# Patient Record
Sex: Female | Born: 2000 | Race: White | Hispanic: No | Marital: Single | Smoking: Never smoker
Health system: Southern US, Community
[De-identification: ages and names within clinical notes are randomized; demographics above are authoritative.]

## PROBLEM LIST (undated history)

## (undated) DIAGNOSIS — J309 Allergic rhinitis, unspecified: Secondary | ICD-10-CM

## (undated) DIAGNOSIS — T7840XA Allergy, unspecified, initial encounter: Secondary | ICD-10-CM

## (undated) DIAGNOSIS — J4520 Mild intermittent asthma, uncomplicated: Secondary | ICD-10-CM

## (undated) DIAGNOSIS — N920 Excessive and frequent menstruation with regular cycle: Secondary | ICD-10-CM

## (undated) HISTORY — DX: Allergic rhinitis, unspecified: J30.9

## (undated) HISTORY — DX: Allergy, unspecified, initial encounter: T78.40XA

## (undated) HISTORY — PX: WISDOM TOOTH EXTRACTION: SHX21

## (undated) HISTORY — DX: Excessive and frequent menstruation with regular cycle: N92.0

## (undated) HISTORY — DX: Mild intermittent asthma, uncomplicated: J45.20

---
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
## 2022-01-22 ENCOUNTER — Ambulatory Visit: Payer: No Typology Code available for payment source | Admitting: Family Medicine

## 2022-01-22 NOTE — Progress Notes (Unsigned)
   [address] [place] [address] Dept: [phone]  FOLLOW UP NOTE  Patient ID: Crystal Bush, female    DOB: December 03, 2000  Age: 21 y.o. MRN: [id number] Date of Office Visit: 01/23/2022  Assessment  Chief Complaint: No chief complaint on file.  HPI Crystal Bush is a 21 year old female who presents to the clinic for follow-up visit.  She was last seen in this clinic on 11/17/2020 by Thermon Leyland, FNP, for evaluation of asthma, allergic rhinitis, allergic conjunctivitis, and anosmia.  Her last environmental allergy testing was   Drug Allergies:  Allergies  Allergen Reactions   Amoxicillin Hives    Physical Exam: There were no vitals taken for this visit.   Physical Exam  Diagnostics:    Assessment and Plan: No diagnosis found.  No orders of the defined types were placed in this encounter.   There are no Patient Instructions on file for this visit.  No follow-ups on file.    Thank you for the opportunity to care for this patient.  Please do not hesitate to contact me with questions.  Thermon Leyland, FNP [facility]

## 2022-01-23 ENCOUNTER — Ambulatory Visit (INDEPENDENT_AMBULATORY_CARE_PROVIDER_SITE_OTHER): Payer: No Typology Code available for payment source | Admitting: Family Medicine

## 2022-01-23 ENCOUNTER — Encounter: Payer: Self-pay | Admitting: Family Medicine

## 2022-01-23 ENCOUNTER — Other Ambulatory Visit: Payer: Self-pay

## 2022-01-23 VITALS — BP 114/68 | HR 67 | Temp 98.7°F | Resp 14 | Ht 64.0 in | Wt 132.5 lb

## 2022-01-23 DIAGNOSIS — J4540 Moderate persistent asthma, uncomplicated: Secondary | ICD-10-CM | POA: Diagnosis not present

## 2022-01-23 DIAGNOSIS — J302 Other seasonal allergic rhinitis: Secondary | ICD-10-CM

## 2022-01-23 DIAGNOSIS — R430 Anosmia: Secondary | ICD-10-CM | POA: Diagnosis not present

## 2022-01-23 DIAGNOSIS — H1013 Acute atopic conjunctivitis, bilateral: Secondary | ICD-10-CM | POA: Diagnosis not present

## 2022-01-23 DIAGNOSIS — H1010 Acute atopic conjunctivitis, unspecified eye: Secondary | ICD-10-CM

## 2022-01-23 DIAGNOSIS — J3089 Other allergic rhinitis: Secondary | ICD-10-CM | POA: Diagnosis not present

## 2022-01-23 MED ORDER — LEVOCETIRIZINE DIHYDROCHLORIDE 5 MG PO TABS: 5.0000 mg | ORAL_TABLET | Freq: Every evening | ORAL | 5 refills | Status: DC

## 2022-01-23 MED ORDER — ALBUTEROL SULFATE HFA 108 (90 BASE) MCG/ACT IN AERS: INHALATION_SPRAY | RESPIRATORY_TRACT | 0 refills | Status: DC

## 2022-01-23 MED ORDER — MOMETASONE FUROATE 50 MCG/ACT NA SUSP: 1.0000 | Freq: Every day | NASAL | 4 refills | Status: DC

## 2022-01-23 MED ORDER — ARNUITY ELLIPTA 200 MCG/ACT IN AEPB: 1.0000 | INHALATION_SPRAY | Freq: Every day | RESPIRATORY_TRACT | 5 refills | Status: DC

## 2022-01-23 NOTE — Addendum Note (Signed)
Addended by: Rolland Bimler D on: 01/23/2022 04:58 PM   Modules accepted: Orders

## 2022-01-23 NOTE — Addendum Note (Signed)
Addended by: Kellie Simmering, Luciann Gossett on: 01/23/2022 04:37 PM   Modules accepted: Orders

## 2022-01-23 NOTE — Patient Instructions (Signed)
Asthma Continue Arnuity 200-1 puff once a day to prevent cough or wheeze. This will replace Arnuity 100 Continue albuterol 2 puffs once every 4 hours as needed for cough or wheeze You may use albuterol 2 puffs 5 to 15 minutes before activity to decrease cough or wheeze  Allergic rhinitis Continue allergen avoidance measures directed toward grass pollen, weed pollen, tree pollen, mold, and dust mite as listed below Continue levocetirizine (Xyzal) 5 mg once a day.  This will replace cetirizine (Zyrtec) for now. Remember to rotate to a different antihistamine about every 3 months. Some examples of over the counter antihistamines include Zyrtec (cetirizine), Xyzal (levocetirizine), Allegra (fexofenadine), and Claritin (loratidine).  Continue Nasonex 1 spray in each nostril once a day as needed for stuffy nose.  In the right nostril, point the applicator out toward the right ear. In the left nostril, point the applicator out toward the left ear Consider saline nasal rinses as needed for nasal symptoms. Use this before any medicated nasal sprays for best result Consider allergen immunotherapy if your symptoms are not well-controlled with the treatment plan as listed above  Allergic conjunctivitis Some over the counter eye drops include Pataday one drop in each eye once a day as needed for red, itchy eyes OR Zaditor one drop in each eye twice a day as needed for red itchy eyes.  Anosmia Continue Nasonex 1 spray in each nostril once a day as needed for stuffy nose.  In the right nostril, point the applicator out toward the right ear. In the left nostril, point the applicator out toward the left ear Continue to follow-up with Dr. Suszanne Conners  Call the clinic if this treatment plan is not working well for you  Follow up in 6 months or sooner if needed.  Reducing Pollen Exposure The American Academy of Allergy, Asthma and Immunology suggests the following steps to reduce your exposure to pollen during allergy  seasons. Do not hang sheets or clothing out to dry; pollen may collect on these items. Do not mow lawns or spend time around freshly cut grass; mowing stirs up pollen. Keep windows closed at night.  Keep car windows closed while driving. Minimize morning activities outdoors, a time when pollen counts are usually at their highest. Stay indoors as much as possible when pollen counts or humidity is high and on windy days when pollen tends to remain in the air longer. Use air conditioning when possible.  Many air conditioners have filters that trap the pollen spores. Use a HEPA room air filter to remove pollen form the indoor air you breathe.  Control of Mold Allergen Mold and fungi can grow on a variety of surfaces provided certain temperature and moisture conditions exist.  Outdoor molds grow on plants, decaying vegetation and soil.  The major outdoor mold, Alternaria and Cladosporium, are found in very high numbers during hot and dry conditions.  Generally, a late Summer - Fall peak is seen for common outdoor fungal spores.  Rain will temporarily lower outdoor mold spore count, but counts rise rapidly when the rainy period ends.  The most important indoor molds are Aspergillus and Penicillium.  Dark, humid and poorly ventilated basements are ideal sites for mold growth.  The next most common sites of mold growth are the bathroom and the kitchen.  Outdoor [employer] Use air conditioning and keep windows closed Avoid exposure to decaying vegetation. Avoid leaf raking. Avoid grain handling. Consider wearing a face mask if working in moldy areas.  Indoor Mold  Control Maintain humidity below 50%. Clean washable surfaces with 5% bleach solution. Remove sources e.g. Contaminated carpets.   Control of Dust Mite Allergen Dust mites play a major role in allergic asthma and rhinitis. They occur in environments with high humidity wherever human skin is found. Dust mites absorb humidity from the  atmosphere (ie, they do not drink) and feed on organic matter (including shed human and animal skin). Dust mites are a microscopic type of insect that you cannot see with the naked eye. High levels of dust mites have been detected from mattresses, pillows, carpets, upholstered furniture, bed covers, clothes, soft toys and any woven material. The principal allergen of the dust mite is found in its feces. A gram of dust may contain 1,000 mites and 250,000 fecal particles. Mite antigen is easily measured in the air during house cleaning activities. Dust mites do not bite and do not cause harm to humans, other than by triggering allergies/asthma.  Ways to decrease your exposure to dust mites in your home:  1. Encase mattresses, box springs and pillows with a mite-impermeable barrier or cover  2. Wash sheets, blankets and drapes weekly in hot water (130 F) with detergent and dry them in a dryer on the hot setting.  3. Have the room cleaned frequently with a vacuum cleaner and a damp dust-mop. For carpeting or rugs, vacuuming with a vacuum cleaner equipped with a high-efficiency particulate air (HEPA) filter. The dust mite allergic individual should not be in a room which is being cleaned and should wait 1 hour after cleaning before going into the room.  4. Do not sleep on upholstered furniture (eg, couches).  5. If possible removing carpeting, upholstered furniture and drapery from the home is ideal. Horizontal blinds should be eliminated in the rooms where the person spends the most time (bedroom, study, television room). Washable vinyl, roller-type shades are optimal.  6. Remove all non-washable stuffed toys from the bedroom. Wash stuffed toys weekly like sheets and blankets above.  7. Reduce indoor humidity to less than 50%. Inexpensive humidity monitors can be purchased at most hardware stores. Do not use a humidifier as can make the problem worse and are not recommended.

## 2022-04-04 ENCOUNTER — Encounter: Payer: Self-pay | Admitting: Radiology

## 2022-10-17 ENCOUNTER — Encounter: Payer: Self-pay | Admitting: *Deleted

## 2023-01-20 ENCOUNTER — Other Ambulatory Visit: Payer: Self-pay | Admitting: Family Medicine

## 2023-02-03 ENCOUNTER — Ambulatory Visit (INDEPENDENT_AMBULATORY_CARE_PROVIDER_SITE_OTHER): Payer: No Typology Code available for payment source | Admitting: Family Medicine

## 2023-02-03 ENCOUNTER — Encounter: Payer: Self-pay | Admitting: Family Medicine

## 2023-02-03 ENCOUNTER — Other Ambulatory Visit: Payer: Self-pay

## 2023-02-03 VITALS — BP 104/60 | HR 73 | Temp 98.3°F | Resp 16 | Ht 62.4 in | Wt 110.7 lb

## 2023-02-03 DIAGNOSIS — H1013 Acute atopic conjunctivitis, bilateral: Secondary | ICD-10-CM | POA: Diagnosis not present

## 2023-02-03 DIAGNOSIS — R197 Diarrhea, unspecified: Secondary | ICD-10-CM | POA: Insufficient documentation

## 2023-02-03 DIAGNOSIS — J4540 Moderate persistent asthma, uncomplicated: Secondary | ICD-10-CM

## 2023-02-03 DIAGNOSIS — J302 Other seasonal allergic rhinitis: Secondary | ICD-10-CM

## 2023-02-03 DIAGNOSIS — J3089 Other allergic rhinitis: Secondary | ICD-10-CM

## 2023-02-03 DIAGNOSIS — R112 Nausea with vomiting, unspecified: Secondary | ICD-10-CM | POA: Insufficient documentation

## 2023-02-03 DIAGNOSIS — T7800XD Anaphylactic reaction due to unspecified food, subsequent encounter: Secondary | ICD-10-CM

## 2023-02-03 DIAGNOSIS — T7800XA Anaphylactic reaction due to unspecified food, initial encounter: Secondary | ICD-10-CM | POA: Insufficient documentation

## 2023-02-03 DIAGNOSIS — R430 Anosmia: Secondary | ICD-10-CM

## 2023-02-03 DIAGNOSIS — H1010 Acute atopic conjunctivitis, unspecified eye: Secondary | ICD-10-CM

## 2023-02-03 MED ORDER — MOMETASONE FUROATE 50 MCG/ACT NA SUSP: 1.0000 | Freq: Every day | NASAL | 5 refills | Status: DC

## 2023-02-03 MED ORDER — LEVOCETIRIZINE DIHYDROCHLORIDE 5 MG PO TABS: 5.0000 mg | ORAL_TABLET | Freq: Every evening | ORAL | 5 refills | Status: DC

## 2023-02-03 MED ORDER — ALBUTEROL SULFATE HFA 108 (90 BASE) MCG/ACT IN AERS: INHALATION_SPRAY | RESPIRATORY_TRACT | 3 refills | Status: DC

## 2023-02-03 MED ORDER — ARNUITY ELLIPTA 200 MCG/ACT IN AEPB: 1.0000 | INHALATION_SPRAY | Freq: Every day | RESPIRATORY_TRACT | 5 refills | Status: DC

## 2023-02-03 NOTE — Patient Instructions (Addendum)
Asthma Continue Arnuity 200-1 puff once a day to prevent cough or wheeze.  Continue albuterol 2 puffs once every 4 hours as needed for cough or wheeze You may use albuterol 2 puffs 5 to 15 minutes before activity to decrease cough or wheeze  Allergic rhinitis Continue allergen avoidance measures directed toward grass pollen, weed pollen, tree pollen, mold, and dust mite as listed below Continue levocetirizine (Xyzal) 5 mg once a day. Remember to rotate to a different antihistamine about every 3 months. Some examples of over the counter antihistamines include Zyrtec (cetirizine), Xyzal (levocetirizine), Allegra (fexofenadine), and Claritin (loratidine).  Continue Nasonex 1 spray in each nostril once a day as needed for stuffy nose.  In the right nostril, point the applicator out toward the right ear. In the left nostril, point the applicator out toward the left ear Consider saline nasal rinses as needed for nasal symptoms. Use this before any medicated nasal sprays for best result Consider allergen immunotherapy if your symptoms are not well-controlled with the treatment plan as listed above  Allergic conjunctivitis Some over the counter eye drops include Pataday one drop in each eye once a day as needed for red, itchy eyes OR Zaditor one drop in each eye twice a day as needed for red itchy eyes.  Anosmia Continue Nasonex 1 spray in each nostril once a day as needed for stuffy nose.  In the right nostril, point the applicator out toward the right ear. In the left nostril, point the applicator out toward the left ear Follow up with ENT for further recommendations  Food allergy/vomiting/nausea/diarrhea Lab orders have been placed to help [place] evaluate your food allergies.  We will call you when the results become available. Return to the clinic later this week or next week for skin testing.  Remember to stop antihistamines for 3 days before your testing appointment. If your symptoms re-occur, begin  a journal of events that occurred for up to 6 hours before your symptoms began including foods and beverages consumed, and medications.  We will place a referral for GI evaluation of nausea, vomiting, and diarrhea  Call the clinic if this treatment plan is not working well for you  Follow up in 1 week or sooner if needed.  Reducing Pollen Exposure The American Academy of Allergy, Asthma and Immunology suggests the following steps to reduce your exposure to pollen during allergy seasons. Do not hang sheets or clothing out to dry; pollen may collect on these items. Do not mow lawns or spend time around freshly cut grass; mowing stirs up pollen. Keep windows closed at night.  Keep car windows closed while driving. Minimize morning activities outdoors, a time when pollen counts are usually at their highest. Stay indoors as much as possible when pollen counts or humidity is high and on windy days when pollen tends to remain in the air longer. Use air conditioning when possible.  Many air conditioners have filters that trap the pollen spores. Use a HEPA room air filter to remove pollen form the indoor air you breathe.  Control of Mold Allergen Mold and fungi can grow on a variety of surfaces provided certain temperature and moisture conditions exist.  Outdoor molds grow on plants, decaying vegetation and soil.  The major outdoor mold, Alternaria and Cladosporium, are found in very high numbers during hot and dry conditions.  Generally, a late Summer - Fall peak is seen for common outdoor fungal spores.  Rain will temporarily lower outdoor mold spore count, but counts  rise rapidly when the rainy period ends.  The most important indoor molds are Aspergillus and Penicillium.  Dark, humid and poorly ventilated basements are ideal sites for mold growth.  The next most common sites of mold growth are the bathroom and the kitchen.  Outdoor [employer] Use air conditioning and keep windows closed Avoid  exposure to decaying vegetation. Avoid leaf raking. Avoid grain handling. Consider wearing a face mask if working in moldy areas.  Indoor Mold Control Maintain humidity below 50%. Clean washable surfaces with 5% bleach solution. Remove sources e.g. Contaminated carpets.   Control of Dust Mite Allergen Dust mites play a major role in allergic asthma and rhinitis. They occur in environments with high humidity wherever human skin is found. Dust mites absorb humidity from the atmosphere (ie, they do not drink) and feed on organic matter (including shed human and animal skin). Dust mites are a microscopic type of insect that you cannot see with the naked eye. High levels of dust mites have been detected from mattresses, pillows, carpets, upholstered furniture, bed covers, clothes, soft toys and any woven material. The principal allergen of the dust mite is found in its feces. A gram of dust may contain 1,000 mites and 250,000 fecal particles. Mite antigen is easily measured in the air during house cleaning activities. Dust mites do not bite and do not cause harm to humans, other than by triggering allergies/asthma.  Ways to decrease your exposure to dust mites in your home:  1. Encase mattresses, box springs and pillows with a mite-impermeable barrier or cover  2. Wash sheets, blankets and drapes weekly in hot water (130 F) with detergent and dry them in a dryer on the hot setting.  3. Have the room cleaned frequently with a vacuum cleaner and a damp dust-mop. For carpeting or rugs, vacuuming with a vacuum cleaner equipped with a high-efficiency particulate air (HEPA) filter. The dust mite allergic individual should not be in a room which is being cleaned and should wait 1 hour after cleaning before going into the room.  4. Do not sleep on upholstered furniture (eg, couches).  5. If possible removing carpeting, upholstered furniture and drapery from the home is ideal. Horizontal blinds should be  eliminated in the rooms where the person spends the most time (bedroom, study, television room). Washable vinyl, roller-type shades are optimal.  6. Remove all non-washable stuffed toys from the bedroom. Wash stuffed toys weekly like sheets and blankets above.  7. Reduce indoor humidity to less than 50%. Inexpensive humidity monitors can be purchased at most hardware stores. Do not use a humidifier as can make the problem worse and are not recommended.

## 2023-02-03 NOTE — Progress Notes (Signed)
[address] [place] [address] Dept: [phone]  FOLLOW UP NOTE  Patient ID: Crystal Bush, female    DOB: [birth year]  Age: 22 y.o. MRN: [id number] Date of Office Visit: 02/03/2023  Assessment  Chief Complaint: Follow-up (Chicken has been making her vomit and sick since July. Other meats have also been giving her GI issues. ) and Allergies (Aggravated due to the contents of the rabbit cage at home. )  HPI Crystal Bush is a 22 year old female who presents to the clinic for follow-up visit.  She was last seen in this clinic on 01/23/2022 by Thermon Leyland, FNP, for evaluation of asthma, allergic rhinitis, allergic conjunctivitis, and anosmia for which she follows up with Dr. Suszanne Conners, ENT specialist.   At today's visit, she reports her asthma has been well-controlled with no shortness of breath, cough, or wheeze with activity or rest.  She continues Arnuity 200-1 puff once a day and rarely uses albuterol for relief of symptoms.  She does occasionally use albuterol before activity to decrease asthma symptoms.    Allergic rhinitis is reported as well-controlled, she is her [employer], however, her symptoms are aggravated by her rabbit while she is at home on holidays and breaks from school.  She reports nasal congestion and clear rhinorrhea as the main symptoms of her allergic rhinitis.  She reports this is much worse after she has been playing with her rabbit.  She continues Nasonex daily, saline rinses daily, and Xyzal 5 mg once a day.  Her last environmental allergy skin prick testing was on 10/27/2012 and was positive to grass pollen, weed pollen, tree pollen, ragweed pollen, mold, and dust mite.   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  Surgery has been recommended by her previous ENT specialist, Dr. Suszanne Conners. She is considering surgical intervention at this time.  She continues Nasonex daily.  Allergic conjunctivitis is reported as moderately  well-controlled with occasional red and itchy eyes mainly occurring when she is in contact with her rapid.  She continues an over-the-counter eyedrop as needed with relief of symptoms.  She reports a new issue at today's visit.  She reports frequent nausea, vomiting, and diarrhea after eating.  She reports that she continues to avoid egg which makes her nauseated for most of her life.  She reports that in April she began vomiting and experiencing diarrhea after eating foods mainly containing chicken.  She reports that these episodes have increased and she experiences vomiting and diarrhea after almost all meats and seafood.  She denies food lodging in her throat.  She reports that she experiences nausea and vomiting about 20 minutes after eating almost any meal.  She reports an unintentional weight loss of between 10 and 20 pounds since April she has not seen a GI specialist at this time.  She denies concomitant cardiopulmonary or integumentary symptoms with these episodes of nausea, vomiting, and diarrhea.  Her current medications are listed in the chart.  Drug Allergies:  Allergies  Allergen Reactions   Amoxicillin Hives    Physical Exam: BP 104/60   Pulse 73   Temp 98.3 F (36.8 C) (Temporal)   Resp 16   Ht 5' 2.4" (1.585 m)   Wt 110 lb 11.2 oz (50.2 kg)   SpO2 98%   BMI 19.99 kg/m    Physical Exam Vitals reviewed.  Constitutional:      Appearance: Normal appearance.  HENT:     Head: Normocephalic and atraumatic.  Right Ear: Tympanic membrane normal.     Left Ear: Tympanic membrane normal.     Nose:     Comments: Bilateral nares slightly erythematous with thin clear nasal drainage noted.  Pharynx normal.  Ears normal.  Eyes normal.    Mouth/Throat:     Pharynx: Oropharynx is clear.  Eyes:     Conjunctiva/sclera: Conjunctivae normal.  Cardiovascular:     Rate and Rhythm: Normal rate and regular rhythm.     Heart sounds: Normal heart sounds. No murmur heard. Pulmonary:      Effort: Pulmonary effort is normal.     Breath sounds: Normal breath sounds.     Comments: Lungs clear to auscultation Musculoskeletal:        General: Normal range of motion.     Cervical back: Normal range of motion and neck supple.  Skin:    General: Skin is warm and dry.  Neurological:     Mental Status: She is alert and oriented to person, place, and time.  Psychiatric:        Mood and Affect: Mood normal.        Behavior: Behavior normal.        Thought Content: Thought content normal.        Judgment: Judgment normal.     Diagnostics: FVC 4.08 which is 113% of predicted value, FEV1 3.66 which is 116% of predicted value.  Spirometry indicates normal ventilatory function.  Assessment and Plan: 1. Allergy with anaphylaxis due to food   2. Moderate persistent asthma without complication   3. Seasonal allergic conjunctivitis   4. Seasonal and perennial allergic rhinitis   5. Anosmia   6. Nausea and vomiting, unspecified vomiting type   7. Diarrhea, unspecified type     Meds ordered this encounter  Medications   Fluticasone Furoate (ARNUITY ELLIPTA) 200 MCG/ACT AEPB    Sig: Inhale 1 puff into the lungs daily.    Dispense:  1 each    Refill:  5   albuterol (VENTOLIN HFA) 108 (90 Base) MCG/ACT inhaler    Sig: INHALE 2 PUFFS EVERY 4 TO 6 HOURS AS NEEDED FOR COUGH OR WHEEZE.    Dispense:  18 g    Refill:  3   levocetirizine (XYZAL) 5 MG tablet    Sig: Take 1 tablet (5 mg total) by mouth every evening.    Dispense:  30 tablet    Refill:  5   mometasone (NASONEX) 50 MCG/ACT nasal spray    Sig: Place 1 spray into the nose daily.    Dispense:  17 g    Refill:  5    Patient Instructions  Asthma Continue Arnuity 200-1 puff once a day to prevent cough or wheeze.  Continue albuterol 2 puffs once every 4 hours as needed for cough or wheeze You may use albuterol 2 puffs 5 to 15 minutes before activity to decrease cough or wheeze  Allergic rhinitis Continue allergen  avoidance measures directed toward grass pollen, weed pollen, tree pollen, mold, and dust mite as listed below Continue levocetirizine (Xyzal) 5 mg once a day. Remember to rotate to a different antihistamine about every 3 months. Some examples of over the counter antihistamines include Zyrtec (cetirizine), Xyzal (levocetirizine), Allegra (fexofenadine), and Claritin (loratidine).  Continue Nasonex 1 spray in each nostril once a day as needed for stuffy nose.  In the right nostril, point the applicator out toward the right ear. In the left nostril, point the applicator out toward the left ear  Consider saline nasal rinses as needed for nasal symptoms. Use this before any medicated nasal sprays for best result Consider allergen immunotherapy if your symptoms are not well-controlled with the treatment plan as listed above  Allergic conjunctivitis Some over the counter eye drops include Pataday one drop in each eye once a day as needed for red, itchy eyes OR Zaditor one drop in each eye twice a day as needed for red itchy eyes.  Anosmia Continue Nasonex 1 spray in each nostril once a day as needed for stuffy nose.  In the right nostril, point the applicator out toward the right ear. In the left nostril, point the applicator out toward the left ear Follow up with ENT for further recommendations  Food allergy/vomiting/nausea/diarrhea Lab orders have been placed to help [place] evaluate your food allergies.  We will call you when the results become available. Return to the clinic later this week or next week for skin testing.  Remember to stop antihistamines for 3 days before your testing appointment. If your symptoms re-occur, begin a journal of events that occurred for up to 6 hours before your symptoms began including foods and beverages consumed, and medications.  We will place a referral for GI evaluation of nausea, vomiting, and diarrhea  Call the clinic if this treatment plan is not working well for  you  Follow up in 1 week or sooner if needed.   Return in about 1 week (around 02/10/2023), or if symptoms worsen or fail to improve.    Thank you for the opportunity to care for this patient.  Please do not hesitate to contact me with questions.  Thermon Leyland, FNP [facility]

## 2023-02-06 LAB — ALLERGEN PROFILE, SHELLFISH
Clam IgE: <0.1 kU/L
F023-IgE Crab: <0.1 kU/L
F080-IgE Lobster: <0.1 kU/L
F290-IgE Oyster: <0.1 kU/L
Scallop IgE: <0.1 kU/L
Shrimp IgE: <0.1 kU/L

## 2023-02-06 LAB — ALLERGEN PROFILE, FOOD-FISH
Allergen Mackerel IgE: <0.1 kU/L
Allergen Salmon IgE: <0.1 kU/L
Allergen Trout IgE: <0.1 kU/L
Allergen Walley Pike IgE: 0.14 kU/L — AB
Codfish IgE: 0.44 kU/L — AB
Halibut IgE: <0.1 kU/L
Tuna: <0.1 kU/L

## 2023-02-06 LAB — ALLERGEN, TURKEY, F284: Allergen Turkey IgE: <0.1 kU/L

## 2023-02-06 LAB — ALLERGEN AVOCADO F96: F096-IgE Avocado: <0.1 kU/L

## 2023-02-06 LAB — EGG COMPONENT PANEL
F232-IgE Ovalbumin: <0.1 kU/L
F233-IgE Ovomucoid: <0.1 kU/L

## 2023-02-06 LAB — ALPHA-GAL PANEL
Allergen Lamb IgE: <0.1 kU/L
Beef IgE: <0.1 kU/L
IgE (Immunoglobulin E), Serum: 32 [IU]/mL (ref 6–495)
O215-IgE Alpha-Gal: <0.1 kU/L
Pork IgE: <0.1 kU/L

## 2023-02-06 LAB — ALLERGEN, CHICKEN F83: Chicken IgE: <0.1 kU/L

## 2023-02-06 LAB — F245-IGE EGG, WHOLE: Egg, Whole IgE: <0.1 kU/L

## 2023-02-07 ENCOUNTER — Other Ambulatory Visit: Payer: Self-pay | Admitting: *Deleted

## 2023-02-07 MED ORDER — EPINEPHRINE 0.3 MG/0.3ML IJ SOAJ: 0.3000 mg | INTRAMUSCULAR | 1 refills | Status: DC | PRN

## 2023-02-12 ENCOUNTER — Telehealth: Payer: Self-pay

## 2023-02-12 NOTE — Telephone Encounter (Addendum)
 Crystal Bush,  Can you give me a hand with both referrals?  She also requested the patient to be sent back to Dr. Karis with [facility], Arlean HERO, FNP  [facility], NT Can you please place a referral to ENT specialist for evaluation of anosmia? She has seen Dr. Karis previously.  Thanks

## 2023-02-12 NOTE — Telephone Encounter (Signed)
 Internal referrals have been placed in Epic for [facility] with DR. Teoh and to [facility].  These referrals are internal so they will reach out to the patient to schedule.  I left a voicemail on Crystal Bush's cell informing her of both referrals and to give them 3 to 5 days to process the referral and call her.  I also stated if she had not heard anything after that time frame, he can call them as well.  Numbers have been left on Crystal Bush's voicemail.

## 2023-02-12 NOTE — Telephone Encounter (Signed)
-----   Message from Thermon Leyland sent at 02/03/2023 12:53 PM EST ----- Can you please place a referral for a GI specialist for evaluation of NVD after meals?

## 2023-02-13 ENCOUNTER — Encounter: Payer: Self-pay | Admitting: Family Medicine

## 2023-02-13 ENCOUNTER — Ambulatory Visit (INDEPENDENT_AMBULATORY_CARE_PROVIDER_SITE_OTHER): Payer: No Typology Code available for payment source | Admitting: Family Medicine

## 2023-02-13 DIAGNOSIS — T7800XA Anaphylactic reaction due to unspecified food, initial encounter: Secondary | ICD-10-CM

## 2023-02-13 DIAGNOSIS — R112 Nausea with vomiting, unspecified: Secondary | ICD-10-CM

## 2023-02-13 DIAGNOSIS — J302 Other seasonal allergic rhinitis: Secondary | ICD-10-CM

## 2023-02-13 DIAGNOSIS — H1013 Acute atopic conjunctivitis, bilateral: Secondary | ICD-10-CM | POA: Diagnosis not present

## 2023-02-13 DIAGNOSIS — J4540 Moderate persistent asthma, uncomplicated: Secondary | ICD-10-CM | POA: Diagnosis not present

## 2023-02-13 DIAGNOSIS — T7800XD Anaphylactic reaction due to unspecified food, subsequent encounter: Secondary | ICD-10-CM

## 2023-02-13 DIAGNOSIS — J3089 Other allergic rhinitis: Secondary | ICD-10-CM

## 2023-02-13 DIAGNOSIS — H1010 Acute atopic conjunctivitis, unspecified eye: Secondary | ICD-10-CM

## 2023-02-13 NOTE — Progress Notes (Addendum)
 [address] [place] [address] Dept: [phone]  FOLLOW UP NOTE  Patient ID: Crystal Bush, female    DOB: May 29, 2000  Age: 23 y.o. MRN: [id number] Date of Office Visit: 02/13/2023  Assessment  Chief Complaint: No chief complaint on file.  HPI Crystal Bush is a 23 year old female who presents to the clinic for follow-up visit.  She was last seen in this clinic on 02/03/2023 by Arlean Mutter, FNP, for evaluation of asthma, allergic rhinitis, allergic conjunctivitis, anosmia, and a new problem of nausea, vomiting, and diarrhea after eating any meal, specifically with chicken consumption.  Lab testing from 02/03/2023 was positive to fish IgE and negative to avocado, shellfish panel, turkey, chicken, egg, and alpha gal.  She presents to the clinic today for follow-up food allergy  skin testing.  At today's visit, she reports that she is feeling well overall with no cardiopulmonary, gastrointestinal, or integumentary symptoms.  She has not had any antihistamines over the last 3 days.  EpiPen  set is up-to-date.  Of note, she does report that her mouth began itching after eating sushi, however, she thought this was due to eating avocado rather than tuna.  Her current medications are listed in the chart.  Drug Allergies:  Allergies  Allergen Reactions   Amoxicillin  Hives    Physical Exam: There were no vitals taken for this visit.   Physical Exam Constitutional:      Appearance: Normal appearance.  HENT:     Head: Normocephalic and atraumatic.  Eyes:     Conjunctiva/sclera: Conjunctivae normal.  Musculoskeletal:        General: Normal range of motion.  Skin:    General: Skin is warm and dry.  Neurological:     Mental Status: She is alert and oriented to person, place, and time.  Psychiatric:        Mood and Affect: Mood normal.        Behavior: Behavior normal.        Thought Content: Thought content normal.        Judgment: Judgment normal.      Diagnostics: Percutaneous selected foods were positive to turkey, tuna and flounder and borderline positive to salmon and Trout with adequate controls.  The  Assessment and Plan: 1. Allergy  with anaphylaxis due to food   2. Seasonal and perennial allergic rhinitis   3. Seasonal allergic conjunctivitis   4. Moderate persistent asthma without complication   5. Nausea and vomiting, unspecified vomiting type     Patient Instructions  Asthma Continue Arnuity 200-1 puff once a day to prevent cough or wheeze.  Continue albuterol  2 puffs once every 4 hours as needed for cough or wheeze You may use albuterol  2 puffs 5 to 15 minutes before activity to decrease cough or wheeze  Allergic rhinitis Continue allergen avoidance measures directed toward grass pollen, weed pollen, tree pollen, mold, and dust mite as listed below Continue levocetirizine (Xyzal ) 5 mg once a day. Remember to rotate to a different antihistamine about every 3 months. Some examples of over the counter antihistamines include Zyrtec (cetirizine), Xyzal  (levocetirizine), Allegra (fexofenadine), and Claritin (loratidine).  Continue Nasonex  1 spray in each nostril once a day as needed for stuffy nose.  In the right nostril, point the applicator out toward the right ear. In the left nostril, point the applicator out toward the left ear Consider saline nasal rinses as needed for nasal symptoms. Use this before any medicated nasal sprays for best result Consider allergen immunotherapy if your symptoms  are not well-controlled with the treatment plan as listed above. Written information provided. Would need to re-test environmental allergies before starting injections.   Allergic conjunctivitis Some over the counter eye drops include Pataday one drop in each eye once a day as needed for red, itchy eyes OR Zaditor one drop in each eye twice a day as needed for red itchy eyes.  Anosmia Continue Nasonex  1 spray in each nostril once a  day as needed for stuffy nose.  In the right nostril, point the applicator out toward the right ear. In the left nostril, point the applicator out toward the left ear Follow up with ENT for further recommendations  Food allergy /vomiting/nausea/diarrhea Your skin testing was positive to fish and turkey.  Your lab testing at your last visit was positive to fish and negative to avocado, shellfish, turkey, chicken, egg, and alpha gal allergy . Continue to avoid fish and turkey at this time. In case of an allergic reaction, take Benadryl 50 mg every 4 hours, and if life-threatening symptoms occur, inject with EpiPen  0.3 mg. A referral for GI evaluation of nausea, vomiting, and diarrhea has been placed at your previous visit  Call the clinic if this treatment plan is not working well for you  Follow up in 6 months or sooner if needed.  Return in about 6 months (around 08/13/2023), or if symptoms worsen or fail to improve.    Thank you for the opportunity to care for this patient.  Please do not hesitate to contact me with questions.  Arlean Mutter, FNP [facility] 

## 2023-02-13 NOTE — Patient Instructions (Addendum)
 Asthma Continue Arnuity 200-1 puff once a day to prevent cough or wheeze.  Continue albuterol  2 puffs once every 4 hours as needed for cough or wheeze You may use albuterol  2 puffs 5 to 15 minutes before activity to decrease cough or wheeze  Allergic rhinitis Continue allergen avoidance measures directed toward grass pollen, weed pollen, tree pollen, mold, and dust mite as listed below Continue levocetirizine (Xyzal ) 5 mg once a day. Remember to rotate to a different antihistamine about every 3 months. Some examples of over the counter antihistamines include Zyrtec (cetirizine), Xyzal  (levocetirizine), Allegra (fexofenadine), and Claritin (loratidine).  Continue Nasonex  1 spray in each nostril once a day as needed for stuffy nose.  In the right nostril, point the applicator out toward the right ear. In the left nostril, point the applicator out toward the left ear Consider saline nasal rinses as needed for nasal symptoms. Use this before any medicated nasal sprays for best result Consider allergen immunotherapy if your symptoms are not well-controlled with the treatment plan as listed above. Written information provided. Would need to re-test environmental allergies before starting injections.   Allergic conjunctivitis Some over the counter eye drops include Pataday one drop in each eye once a day as needed for red, itchy eyes OR Zaditor one drop in each eye twice a day as needed for red itchy eyes.  Anosmia Continue Nasonex  1 spray in each nostril once a day as needed for stuffy nose.  In the right nostril, point the applicator out toward the right ear. In the left nostril, point the applicator out toward the left ear Follow up with ENT for further recommendations  Food allergy /vomiting/nausea/diarrhea Your skin testing was positive to fish and turkey.  Your lab testing at your last visit was positive to fish and negative to avocado, shellfish, turkey, chicken, egg, and alpha gal  allergy . Continue to avoid fish and turkey at this time. In case of an allergic reaction, take Benadryl 50 mg every 4 hours, and if life-threatening symptoms occur, inject with EpiPen  0.3 mg. A referral for GI evaluation of nausea, vomiting, and diarrhea has been placed at your previous visit  Call the clinic if this treatment plan is not working well for you  Follow up in 6 months or sooner if needed.  Reducing Pollen Exposure The American Academy of Allergy , Asthma and Immunology suggests the following steps to reduce your exposure to pollen during allergy  seasons. Do not hang sheets or clothing out to dry; pollen may collect on these items. Do not mow lawns or spend time around freshly cut grass; mowing stirs up pollen. Keep windows closed at night.  Keep car windows closed while driving. Minimize morning activities outdoors, a time when pollen counts are usually at their highest. Stay indoors as much as possible when pollen counts or humidity is high and on windy days when pollen tends to remain in the air longer. Use air conditioning when possible.  Many air conditioners have filters that trap the pollen spores. Use a HEPA room air filter to remove pollen form the indoor air you breathe.  Control of Mold Allergen Mold and fungi can grow on a variety of surfaces provided certain temperature and moisture conditions exist.  Outdoor molds grow on plants, decaying vegetation and soil.  The major outdoor mold, Alternaria and Cladosporium, are found in very high numbers during hot and dry conditions.  Generally, a late Summer - Fall peak is seen for common outdoor fungal spores.  Rain will temporarily  lower outdoor mold spore count, but counts rise rapidly when the rainy period ends.  The most important indoor molds are Aspergillus and Penicillium.  Dark, humid and poorly ventilated basements are ideal sites for mold growth.  The next most common sites of mold growth are the bathroom and the  kitchen.  Outdoor [employer] Use air conditioning and keep windows closed Avoid exposure to decaying vegetation. Avoid leaf raking. Avoid grain handling. Consider wearing a face mask if working in moldy areas.  Indoor Mold Control Maintain humidity below 50%. Clean washable surfaces with 5% bleach solution. Remove sources e.g. Contaminated carpets.   Control of Dust Mite Allergen Dust mites play a major role in allergic asthma and rhinitis. They occur in environments with high humidity wherever human skin is found. Dust mites absorb humidity from the atmosphere (ie, they do not drink) and feed on organic matter (including shed human and animal skin). Dust mites are a microscopic type of insect that you cannot see with the naked eye. High levels of dust mites have been detected from mattresses, pillows, carpets, upholstered furniture, bed covers, clothes, soft toys and any woven material. The principal allergen of the dust mite is found in its feces. A gram of dust may contain 1,000 mites and 250,000 fecal particles. Mite antigen is easily measured in the air during house cleaning activities. Dust mites do not bite and do not cause harm to humans, other than by triggering allergies/asthma.  Ways to decrease your exposure to dust mites in your home:  1. Encase mattresses, box springs and pillows with a mite-impermeable barrier or cover  2. Wash sheets, blankets and drapes weekly in hot water (130 F) with detergent and dry them in a dryer on the hot setting.  3. Have the room cleaned frequently with a vacuum cleaner and a damp dust-mop. For carpeting or rugs, vacuuming with a vacuum cleaner equipped with a high-efficiency particulate air (HEPA) filter. The dust mite allergic individual should not be in a room which is being cleaned and should wait 1 hour after cleaning before going into the room.  4. Do not sleep on upholstered furniture (eg, couches).  5. If possible removing carpeting,  upholstered furniture and drapery from the home is ideal. Horizontal blinds should be eliminated in the rooms where the person spends the most time (bedroom, study, television room). Washable vinyl, roller-type shades are optimal.  6. Remove all non-washable stuffed toys from the bedroom. Wash stuffed toys weekly like sheets and blankets above.  7. Reduce indoor humidity to less than 50%. Inexpensive humidity monitors can be purchased at most hardware stores. Do not use a humidifier as can make the problem worse and are not recommended.

## 2023-03-17 ENCOUNTER — Encounter (INDEPENDENT_AMBULATORY_CARE_PROVIDER_SITE_OTHER): Payer: Self-pay | Admitting: Otolaryngology

## 2023-05-01 ENCOUNTER — Ambulatory Visit (INDEPENDENT_AMBULATORY_CARE_PROVIDER_SITE_OTHER): Payer: No Typology Code available for payment source

## 2023-07-08 ENCOUNTER — Encounter (INDEPENDENT_AMBULATORY_CARE_PROVIDER_SITE_OTHER): Payer: Self-pay | Admitting: Otolaryngology

## 2023-07-08 ENCOUNTER — Ambulatory Visit (INDEPENDENT_AMBULATORY_CARE_PROVIDER_SITE_OTHER): Admitting: Otolaryngology

## 2023-07-08 VITALS — BP 115/73 | HR 67

## 2023-07-08 DIAGNOSIS — J342 Deviated nasal septum: Secondary | ICD-10-CM | POA: Diagnosis not present

## 2023-07-08 DIAGNOSIS — J310 Chronic rhinitis: Secondary | ICD-10-CM | POA: Diagnosis not present

## 2023-07-08 DIAGNOSIS — R0981 Nasal congestion: Secondary | ICD-10-CM

## 2023-07-08 DIAGNOSIS — J343 Hypertrophy of nasal turbinates: Secondary | ICD-10-CM

## 2023-07-08 NOTE — Progress Notes (Unsigned)
 CC: Chronic nasal obstruction, anosmia  HPI:  Crystal Bush is a 23 y.o. female who presents today complaining of chronic nasal obstruction and anosmia.  According to the patient, she has significant difficulty breathing through her nose for most of her life. She has never been able to smell well. The patient cannot smell even very strong odors. The patient has been on multiple oral and topical allergy  medications including steroid nasal sprays for years with no relief. She is a habitual mouth breather which causes dry mouth and throat irritation. The patient has no history of recurrent sinus infections. She does not recall any nasal trauma. Previous ENT surgery is denied.  At her previous visit more than 3 years ago, she was noted to have severe right nasal septal deviation and bilateral inferior turbinate hypertrophy.  The treatment options were discussed at that time.  The patient elected to continue medical treatment.  According to the patient, she continues to be symptomatic, and is interested in more definitive surgical therapy.  Past Medical History:  Diagnosis Date   Allergic rhinitis 07/16/2012   Allergy     [employer] 11/11/2019   Asthma, mild intermittent 07/16/2012   Heavy menses     Past Surgical History:  Procedure Laterality Date   WISDOM TOOTH EXTRACTION      Family History  Problem Relation Age of Onset   Healthy Mother    Lupus Paternal Grandfather    Lupus Maternal Grandmother    Heart disease Maternal Grandmother    Alcoholism Maternal Grandfather     Social History:  reports that she has never smoked. She has never used smokeless tobacco. She reports that she does not drink alcohol and does not use drugs.  Allergies:  Allergies  Allergen Reactions   Amoxicillin  Hives    Prior to Admission medications   Medication Sig Start Date End Date Taking? Authorizing Provider  albuterol  (VENTOLIN  HFA) 108 (90 Base) MCG/ACT inhaler INHALE 2 PUFFS EVERY 4 TO 6 HOURS AS  NEEDED FOR COUGH OR WHEEZE. 02/03/23  Yes Ambs, Jeanmarie Millet, FNP  EPINEPHrine  (EPIPEN  2-PAK) 0.3 mg/0.3 mL IJ SOAJ injection Inject 0.3 mg into the muscle as needed for anaphylaxis. 02/07/23  Yes Ambs, Jeanmarie Millet, FNP  Fluticasone  Furoate (ARNUITY ELLIPTA ) 200 MCG/ACT AEPB Inhale 1 puff into the lungs daily. 02/03/23  Yes Ambs, Jeanmarie Millet, FNP  levocetirizine (XYZAL ) 5 MG tablet Take 1 tablet (5 mg total) by mouth every evening. 02/03/23  Yes Ambs, Jeanmarie Millet, FNP  mometasone  (NASONEX ) 50 MCG/ACT nasal spray Place 1 spray into the nose daily. 02/03/23  Yes Ambs, Jeanmarie Millet, FNP    Blood pressure 115/73, pulse 67, SpO2 98%. Exam: General: Communicates without difficulty, well nourished, no acute distress. Head: Normocephalic, no evidence injury, no tenderness, facial buttresses intact without stepoff. Face/sinus: No tenderness to palpation and percussion. Facial movement is normal and symmetric. Eyes: PERRL, EOMI. No scleral icterus, conjunctivae clear. Neuro: CN II exam reveals vision grossly intact.  No nystagmus at any point of gaze. Ears: Auricles well formed without lesions.  Ear canals are intact without mass or lesion.  No erythema or edema is appreciated.  The TMs are intact without fluid. Nose: External evaluation reveals normal support and skin without lesions.  Dorsum is intact.  Anterior rhinoscopy reveals congested mucosa over anterior aspect of inferior turbinates and deviated septum.  No purulence noted. Oral:  Oral cavity and oropharynx are intact, symmetric, without erythema or edema.  Mucosa is moist without lesions. Neck: Full range of  motion without pain.  There is no significant lymphadenopathy.  No masses palpable.  Thyroid bed within normal limits to palpation.  Parotid glands and submandibular glands equal bilaterally without mass.  Trachea is midline. Neuro:  CN 2-12 grossly intact.   Procedure:  Flexible Nasal Endoscopy: Description: Risks, benefits, and alternatives of flexible endoscopy were explained  to the patient.  Specific mention was made of the risk of throat numbness with difficulty swallowing, possible bleeding from the nose and mouth, and pain from the procedure.  The patient gave oral consent to proceed.  The flexible scope was inserted into the right nasal cavity.  Endoscopy of the interior nasal cavity, superior, inferior, and middle meatus was performed. The sphenoid-ethmoid recess was examined. Edematous mucosa was noted.  No polyp, mass, or lesion was appreciated. Nasal septal deviation noted. Olfactory cleft was clear.  Nasopharynx was clear.  Turbinates were hypertrophied but without mass.  The procedure was repeated on the contralateral side with similar findings.  The patient tolerated the procedure well.    Assessment: 1.  Chronic rhinitis with nasal mucosal congestion, nasal septal deviation, and bilateral inferior turbinate hypertrophy. 2.  More than 95% of her nasal passageways are obstructed bilaterally. 3.  The patient has not responded to medical treatment for 5+ years.  Plan: 1.  The physical exam and nasal endoscopy findings are reviewed with the patient. 2.  Continue with Flonase and Xyzal  daily. 3.  Nasal saline irrigation is encouraged. 4.  In light of her persistent symptoms, she may benefit from surgical intervention with septoplasty and bilateral turbinate reduction.  The risks, benefits, alternatives, and details of the procedures are extensively reviewed.  Questions were invited and answered. 5.  It is explained to the patient that her nasal obstruction will likely improve with the surgery.  However, her long-term anosmia may or may not improve with nasal surgery. 6.  The patient would like to proceed with the procedures.  We will schedule the procedures in accordance with the patient's schedule.  Kevron Patella W Kersten Salmons 07/08/2023, 4:40 PM

## 2023-07-10 DIAGNOSIS — J310 Chronic rhinitis: Secondary | ICD-10-CM | POA: Insufficient documentation

## 2023-07-10 DIAGNOSIS — J343 Hypertrophy of nasal turbinates: Secondary | ICD-10-CM | POA: Insufficient documentation

## 2023-07-10 DIAGNOSIS — J342 Deviated nasal septum: Secondary | ICD-10-CM | POA: Insufficient documentation

## 2023-07-21 ENCOUNTER — Other Ambulatory Visit: Payer: Self-pay

## 2023-07-21 ENCOUNTER — Encounter (HOSPITAL_BASED_OUTPATIENT_CLINIC_OR_DEPARTMENT_OTHER): Payer: Self-pay | Admitting: Otolaryngology

## 2023-07-28 ENCOUNTER — Ambulatory Visit (HOSPITAL_BASED_OUTPATIENT_CLINIC_OR_DEPARTMENT_OTHER)
Admission: RE | Admit: 2023-07-28 | Discharge: 2023-07-28 | Disposition: A | Discharge status: Home / Self Care | Attending: Otolaryngology | Admitting: Otolaryngology

## 2023-07-28 ENCOUNTER — Ambulatory Visit (HOSPITAL_BASED_OUTPATIENT_CLINIC_OR_DEPARTMENT_OTHER): Admitting: Anesthesiology

## 2023-07-28 ENCOUNTER — Encounter (HOSPITAL_BASED_OUTPATIENT_CLINIC_OR_DEPARTMENT_OTHER)
Admission: RE | Disposition: A | Discharge status: Home / Self Care | Payer: Self-pay | Source: Home / Self Care | Attending: Otolaryngology

## 2023-07-28 ENCOUNTER — Encounter (HOSPITAL_BASED_OUTPATIENT_CLINIC_OR_DEPARTMENT_OTHER): Payer: Self-pay | Admitting: Otolaryngology

## 2023-07-28 DIAGNOSIS — Z01818 Encounter for other preprocedural examination: Secondary | ICD-10-CM

## 2023-07-28 DIAGNOSIS — J342 Deviated nasal septum: Secondary | ICD-10-CM

## 2023-07-28 DIAGNOSIS — J343 Hypertrophy of nasal turbinates: Secondary | ICD-10-CM

## 2023-07-28 DIAGNOSIS — J3489 Other specified disorders of nose and nasal sinuses: Secondary | ICD-10-CM | POA: Diagnosis not present

## 2023-07-28 DIAGNOSIS — R430 Anosmia: Secondary | ICD-10-CM | POA: Diagnosis not present

## 2023-07-28 HISTORY — PX: NASAL SEPTOPLASTY W/ TURBINOPLASTY: SHX2070

## 2023-07-28 LAB — POCT PREGNANCY, URINE: Preg Test, Ur: NEGATIVE

## 2023-07-28 SURGERY — SEPTOPLASTY, NOSE, WITH NASAL TURBINATE REDUCTION
Anesthesia: General | Site: Nose | Laterality: Bilateral

## 2023-07-28 MED ORDER — FENTANYL CITRATE (PF) 250 MCG/5ML IJ SOLN
INTRAMUSCULAR | Status: DC | PRN
  Administered 2023-07-28: 100 ug via INTRAVENOUS

## 2023-07-28 MED ORDER — FENTANYL CITRATE (PF) 100 MCG/2ML IJ SOLN
INTRAMUSCULAR | Status: AC
Start: 2023-07-28 — End: 2023-07-28
  Filled 2023-07-28: qty 2

## 2023-07-28 MED ORDER — LIDOCAINE 2% (20 MG/ML) 5 ML SYRINGE
INTRAMUSCULAR | Status: AC
  Filled 2023-07-28: qty 5

## 2023-07-28 MED ORDER — DEXAMETHASONE SODIUM PHOSPHATE 10 MG/ML IJ SOLN
INTRAMUSCULAR | Status: AC
  Filled 2023-07-28: qty 1

## 2023-07-28 MED ORDER — DEXMEDETOMIDINE HCL IN NACL 80 MCG/20ML IV SOLN
INTRAVENOUS | Status: AC
Start: 2023-07-28 — End: 2023-07-28
  Filled 2023-07-28: qty 20

## 2023-07-28 MED ORDER — ONDANSETRON HCL 4 MG/2ML IJ SOLN
INTRAMUSCULAR | Status: AC
  Filled 2023-07-28: qty 2

## 2023-07-28 MED ORDER — OXYCODONE HCL 5 MG/5ML PO SOLN: 5.0000 mg | Freq: Once | ORAL | Status: AC | PRN

## 2023-07-28 MED ORDER — LIDOCAINE 2% (20 MG/ML) 5 ML SYRINGE
INTRAMUSCULAR | Status: DC | PRN
Start: 2023-07-28 — End: 2023-07-28
  Administered 2023-07-28: 40 mg via INTRAVENOUS

## 2023-07-28 MED ORDER — CEFAZOLIN SODIUM-DEXTROSE 2-3 GM-%(50ML) IV SOLR
INTRAVENOUS | Status: DC | PRN
  Administered 2023-07-28: 2 g via INTRAVENOUS

## 2023-07-28 MED ORDER — DEXMEDETOMIDINE HCL IN NACL 80 MCG/20ML IV SOLN
INTRAVENOUS | Status: DC | PRN
  Administered 2023-07-28: 8 ug via INTRAVENOUS

## 2023-07-28 MED ORDER — ACETAMINOPHEN 500 MG PO TABS
ORAL_TABLET | ORAL | Status: AC
  Filled 2023-07-28: qty 2

## 2023-07-28 MED ORDER — AMISULPRIDE (ANTIEMETIC) 5 MG/2ML IV SOLN: 10.0000 mg | Freq: Once | INTRAVENOUS | Status: DC | PRN

## 2023-07-28 MED ORDER — PROPOFOL 1000 MG/100ML IV EMUL
INTRAVENOUS | Status: AC
  Filled 2023-07-28: qty 100

## 2023-07-28 MED ORDER — LIDOCAINE-EPINEPHRINE 1 %-1:100000 IJ SOLN
INTRAMUSCULAR | Status: DC | PRN
Start: 2023-07-28 — End: 2023-07-28
  Administered 2023-07-28: 4 mL

## 2023-07-28 MED ORDER — ROCURONIUM BROMIDE 10 MG/ML (PF) SYRINGE
PREFILLED_SYRINGE | INTRAVENOUS | Status: DC | PRN
  Administered 2023-07-28: 40 mg via INTRAVENOUS
  Administered 2023-07-28: 10 mg via INTRAVENOUS

## 2023-07-28 MED ORDER — ROCURONIUM BROMIDE 10 MG/ML (PF) SYRINGE
PREFILLED_SYRINGE | INTRAVENOUS | Status: AC
  Filled 2023-07-28: qty 10

## 2023-07-28 MED ORDER — MUPIROCIN 2 % EX OINT
TOPICAL_OINTMENT | CUTANEOUS | Status: DC | PRN
  Administered 2023-07-28: 1 via TOPICAL

## 2023-07-28 MED ORDER — ONDANSETRON HCL 4 MG/2ML IJ SOLN
INTRAMUSCULAR | Status: DC | PRN
  Administered 2023-07-28: 4 mg via INTRAVENOUS

## 2023-07-28 MED ORDER — OXYCODONE HCL 5 MG PO TABS
5.0000 mg | ORAL_TABLET | Freq: Once | ORAL | Status: AC | PRN
  Administered 2023-07-28: 5 mg via ORAL

## 2023-07-28 MED ORDER — DEXAMETHASONE SODIUM PHOSPHATE 10 MG/ML IJ SOLN
INTRAMUSCULAR | Status: DC | PRN
  Administered 2023-07-28: 10 mg via INTRAVENOUS

## 2023-07-28 MED ORDER — MIDAZOLAM HCL 2 MG/2ML IJ SOLN
INTRAMUSCULAR | Status: AC
  Filled 2023-07-28: qty 2

## 2023-07-28 MED ORDER — PROPOFOL 500 MG/50ML IV EMUL
INTRAVENOUS | Status: AC
  Filled 2023-07-28: qty 50

## 2023-07-28 MED ORDER — PROPOFOL 10 MG/ML IV BOLUS
INTRAVENOUS | Status: DC | PRN
  Administered 2023-07-28: 200 ug/kg/min via INTRAVENOUS
  Administered 2023-07-28: 150 mg via INTRAVENOUS

## 2023-07-28 MED ORDER — PROPOFOL 10 MG/ML IV BOLUS
INTRAVENOUS | Status: AC
Start: 2023-07-28 — End: 2023-07-28
  Filled 2023-07-28: qty 20

## 2023-07-28 MED ORDER — SUGAMMADEX SODIUM 200 MG/2ML IV SOLN
INTRAVENOUS | Status: DC | PRN
  Administered 2023-07-28: 200 mg via INTRAVENOUS

## 2023-07-28 MED ORDER — CEFAZOLIN SODIUM 1 G IJ SOLR
INTRAMUSCULAR | Status: AC
  Filled 2023-07-28: qty 40

## 2023-07-28 MED ORDER — MEPERIDINE HCL 25 MG/ML IJ SOLN: 6.2500 mg | INTRAMUSCULAR | Status: DC | PRN

## 2023-07-28 MED ORDER — PROPOFOL 10 MG/ML IV BOLUS
INTRAVENOUS | Status: AC
  Filled 2023-07-28: qty 20

## 2023-07-28 MED ORDER — LACTATED RINGERS IV SOLN: INTRAVENOUS | Status: DC

## 2023-07-28 MED ORDER — FENTANYL CITRATE (PF) 100 MCG/2ML IJ SOLN
INTRAMUSCULAR | Status: AC
  Filled 2023-07-28: qty 2

## 2023-07-28 MED ORDER — OXYMETAZOLINE HCL 0.05 % NA SOLN
NASAL | Status: DC | PRN
  Administered 2023-07-28: 1 via TOPICAL

## 2023-07-28 MED ORDER — MIDAZOLAM HCL 5 MG/5ML IJ SOLN
INTRAMUSCULAR | Status: DC | PRN
  Administered 2023-07-28: 2 mg via INTRAVENOUS

## 2023-07-28 MED ORDER — ACETAMINOPHEN 500 MG PO TABS
1000.0000 mg | ORAL_TABLET | Freq: Once | ORAL | Status: AC
  Administered 2023-07-28: 1000 mg via ORAL

## 2023-07-28 MED ORDER — FENTANYL CITRATE (PF) 100 MCG/2ML IJ SOLN
25.0000 ug | INTRAMUSCULAR | Status: DC | PRN
  Administered 2023-07-28: 50 ug via INTRAVENOUS

## 2023-07-28 MED ORDER — OXYCODONE HCL 5 MG PO TABS
ORAL_TABLET | ORAL | Status: AC
Start: 2023-07-28 — End: 2023-07-28
  Filled 2023-07-28: qty 1

## 2023-07-28 SURGICAL SUPPLY — 31 items
CANISTER SUCT 1200ML W/VALVE (MISCELLANEOUS) ×1 IMPLANT
COAGULATOR SUCT 8FR VV (MISCELLANEOUS) ×1 IMPLANT
DEFOGGER MIRROR 1QT (MISCELLANEOUS) ×1 IMPLANT
DRSG NASOPORE 8CM (GAUZE/BANDAGES/DRESSINGS) IMPLANT
DRSG TELFA 3X8 NADH STRL (GAUZE/BANDAGES/DRESSINGS) IMPLANT
ELECTRODE REM PT RTRN 9FT ADLT (ELECTROSURGICAL) ×1 IMPLANT
GAUZE SPONGE 2X2 STRL 8-PLY (GAUZE/BANDAGES/DRESSINGS) ×1 IMPLANT
GLOVE BIO SURGEON STRL SZ7.5 (GLOVE) ×1 IMPLANT
GLOVE BIOGEL PI IND STRL 7.0 (GLOVE) IMPLANT
GLOVE SURG SYN 7.5 E (GLOVE) ×2 IMPLANT
GLOVE SURG SYN 7.5 PF PI (GLOVE) IMPLANT
GOWN STRL REUS W/ TWL LRG LVL3 (GOWN DISPOSABLE) ×2 IMPLANT
NDL HYPO 25X1 1.5 SAFETY (NEEDLE) ×1 IMPLANT
NEEDLE HYPO 25X1 1.5 SAFETY (NEEDLE) ×1 IMPLANT
NS IRRIG 1000ML POUR BTL (IV SOLUTION) ×1 IMPLANT
PACK BASIN DAY SURGERY FS (CUSTOM PROCEDURE TRAY) ×1 IMPLANT
PACK ENT DAY SURGERY (CUSTOM PROCEDURE TRAY) ×1 IMPLANT
PAD MAGNETIC INSTR ST 16X20 (MISCELLANEOUS) IMPLANT
SLEEVE SCD COMPRESS KNEE MED (STOCKING) IMPLANT
SPIKE FLUID TRANSFER (MISCELLANEOUS) IMPLANT
SPLINT NASAL AIRWAY SILICONE (MISCELLANEOUS) ×1 IMPLANT
SPONGE NEURO XRAY DETECT 1X3 (DISPOSABLE) ×1 IMPLANT
SUT CHROMIC 4 0 P 3 18 (SUTURE) ×1 IMPLANT
SUT CHROMIC 4 0 RB 1X27 (SUTURE) IMPLANT
SUT PLAIN 4 0 ~~LOC~~ 1 (SUTURE) ×1 IMPLANT
SUT PROLENE 3 0 PS 2 (SUTURE) ×1 IMPLANT
SUT VIC AB 4-0 P-3 18XBRD (SUTURE) IMPLANT
TOWEL GREEN STERILE FF (TOWEL DISPOSABLE) ×1 IMPLANT
TUBE SALEM SUMP 12FR 48 (TUBING) IMPLANT
TUBE SALEM SUMP 16F (TUBING) ×1 IMPLANT
YANKAUER SUCT BULB TIP NO VENT (SUCTIONS) ×1 IMPLANT

## 2023-07-28 NOTE — H&P (Signed)
 CC: Chronic nasal obstruction, anosmia   HPI:  Crystal Bush is a 23 y.o. female who presents today complaining of chronic nasal obstruction and anosmia.  According to the patient, she has significant difficulty breathing through her nose for most of her life. She has never been able to smell well. The patient cannot smell even very strong odors. The patient has been on multiple oral and topical allergy  medications including steroid nasal sprays for years with no relief. She is a habitual mouth breather which causes dry mouth and throat irritation. The patient has no history of recurrent sinus infections. She does not recall any nasal trauma. Previous ENT surgery is denied.  At her previous visit more than 3 years ago, she was noted to have severe right nasal septal deviation and bilateral inferior turbinate hypertrophy.  The treatment options were discussed at that time.  The patient elected to continue medical treatment.  According to the patient, she continues to be symptomatic, and is interested in more definitive surgical therapy.       Past Medical History:  Diagnosis Date   Allergic rhinitis 07/16/2012   Allergy       [employer] 11/11/2019   Asthma, mild intermittent 07/16/2012   Heavy menses                 Past Surgical History:  Procedure Laterality Date   WISDOM TOOTH EXTRACTION                   Family History  Problem Relation Age of Onset   Healthy Mother     Lupus Paternal Grandfather     Lupus Maternal Grandmother     Heart disease Maternal Grandmother     Alcoholism Maternal Grandfather            Social History:  reports that she has never smoked. She has never used smokeless tobacco. She reports that she does not drink alcohol and does not use drugs.   Allergies:  Allergies      Allergies  Allergen Reactions   Amoxicillin  Hives               Prior to Admission medications   Medication Sig Start Date End Date Taking? Authorizing Provider  albuterol   (VENTOLIN  HFA) 108 (90 Base) MCG/ACT inhaler INHALE 2 PUFFS EVERY 4 TO 6 HOURS AS NEEDED FOR COUGH OR WHEEZE. 02/03/23   Yes Ambs, Arlean HERO, FNP  EPINEPHrine  (EPIPEN  2-PAK) 0.3 mg/0.3 mL IJ SOAJ injection Inject 0.3 mg into the muscle as needed for anaphylaxis. 02/07/23   Yes Ambs, Arlean HERO, FNP  Fluticasone  Furoate (ARNUITY ELLIPTA ) 200 MCG/ACT AEPB Inhale 1 puff into the lungs daily. 02/03/23   Yes Ambs, Arlean HERO, FNP  levocetirizine (XYZAL ) 5 MG tablet Take 1 tablet (5 mg total) by mouth every evening. 02/03/23   Yes Ambs, Arlean HERO, FNP  mometasone  (NASONEX ) 50 MCG/ACT nasal spray Place 1 spray into the nose daily. 02/03/23   Yes Ambs, Arlean HERO, FNP      Blood pressure 115/73, pulse 67, SpO2 98%. Exam: General: Communicates without difficulty, well nourished, no acute distress. Head: Normocephalic, no evidence injury, no tenderness, facial buttresses intact without stepoff. Face/sinus: No tenderness to palpation and percussion. Facial movement is normal and symmetric. Eyes: PERRL, EOMI. No scleral icterus, conjunctivae clear. Neuro: CN II exam reveals vision grossly intact.  No nystagmus at any point of gaze. Ears: Auricles well formed without lesions.  Ear canals are intact without mass or lesion.  No erythema or edema is appreciated.  The TMs are intact without fluid. Nose: External evaluation reveals normal support and skin without lesions.  Dorsum is intact.  Anterior rhinoscopy reveals congested mucosa over anterior aspect of inferior turbinates and deviated septum.  No purulence noted. Oral:  Oral cavity and oropharynx are intact, symmetric, without erythema or edema.  Mucosa is moist without lesions. Neck: Full range of motion without pain.  There is no significant lymphadenopathy.  No masses palpable.  Thyroid bed within normal limits to palpation.  Parotid glands and submandibular glands equal bilaterally without mass.  Trachea is midline. Neuro:  CN 2-12 grossly intact.    Procedure:  Flexible Nasal  Endoscopy: Description: Risks, benefits, and alternatives of flexible endoscopy were explained to the patient.  Specific mention was made of the risk of throat numbness with difficulty swallowing, possible bleeding from the nose and mouth, and pain from the procedure.  The patient gave oral consent to proceed.  The flexible scope was inserted into the right nasal cavity.  Endoscopy of the interior nasal cavity, superior, inferior, and middle meatus was performed. The sphenoid-ethmoid recess was examined. Edematous mucosa was noted.  No polyp, mass, or lesion was appreciated. Nasal septal deviation noted. Olfactory cleft was clear.  Nasopharynx was clear.  Turbinates were hypertrophied but without mass.  The procedure was repeated on the contralateral side with similar findings.  The patient tolerated the procedure well.     Assessment: 1.  Chronic rhinitis with nasal mucosal congestion, nasal septal deviation, and bilateral inferior turbinate hypertrophy. 2.  More than 95% of her nasal passageways are obstructed bilaterally. 3.  The patient has not responded to medical treatment for 5+ years.   Plan: 1.  The physical exam and nasal endoscopy findings are reviewed with the patient. 2.  Continue with Flonase and Xyzal  daily. 3.  Nasal saline irrigation is encouraged. 4.  In light of her persistent symptoms, she may benefit from surgical intervention with septoplasty and bilateral turbinate reduction.  The risks, benefits, alternatives, and details of the procedures are extensively reviewed.  Questions were invited and answered. 5.  It is explained to the patient that her nasal obstruction will likely improve with the surgery.  However, her long-term anosmia may or may not improve with nasal surgery. 6.  The patient would like to proceed with the procedures.

## 2023-07-28 NOTE — Discharge Instructions (Addendum)

## 2023-07-28 NOTE — Op Note (Signed)
 DATE OF PROCEDURE: 07/28/2023  OPERATIVE REPORT   SURGEON: Daniel Moccasin, MD   PREOPERATIVE DIAGNOSES:  1. Severe nasal septal deviation.  2. Bilateral inferior turbinate hypertrophy.  3. Chronic nasal obstruction.  POSTOPERATIVE DIAGNOSES:  1. Severe nasal septal deviation.  2. Bilateral inferior turbinate hypertrophy.  3. Chronic nasal obstruction.  PROCEDURE PERFORMED:  1. Septoplasty.  2. Bilateral partial inferior turbinate resection.   ANESTHESIA: General endotracheal tube anesthesia.   COMPLICATIONS: None.   ESTIMATED BLOOD LOSS: 200 mL.   INDICATION FOR PROCEDURE: Crystal Bush is a 23 y.o. female with a history of chronic nasal obstruction. The patient was treated with antihistamine, decongestant, and steroid nasal sprays. However, the patient continued to be symptomatic. On examination, the patient was noted to have bilateral severe inferior turbinate hypertrophy and significant nasal septal deviation, causing significant nasal obstruction. Based on the above findings, the decision was made for the patient to undergo the above-stated procedures. The risks, benefits, alternatives, and details of the procedures were discussed with the patient. Questions were invited and answered. Informed consent was obtained.   DESCRIPTION OF PROCEDURE: The patient was taken to the operating room and placed supine on the operating table. General endotracheal tube anesthesia was administered by the anesthesiologist. The patient was positioned, and prepped and draped in the standard fashion for nasal surgery. Pledgets soaked with Afrin were placed in both nasal cavities for decongestion. The pledgets were subsequently removed.   Examination of the nasal cavity revealed a severe nasal septal deviation. 1% lidocaine with 1:100,000 epinephrine  was injected onto the nasal septum bilaterally. A hemitransfixion incision was made on the left side. The mucosal flap was carefully elevated on the left side. A  cartilaginous incision was made 1 cm superior to the caudal margin of the nasal septum. Mucosal flap was also elevated on the right side in the similar fashion. It should be noted that due to the severe septal deviation, the deviated portion of the cartilaginous and bony septum had to be removed in piecemeal fashion. Once the deviated portions were removed, a straight midline septum was achieved. The septum was then quilted with 4-0 plain gut sutures. The hemitransfixion incision was closed with interrupted 4-0 chromic sutures.   The inferior one half of both hypertrophied inferior turbinate was crossclamped with a Kelly clamp. The inferior one half of each inferior turbinate was then resected with a pair of cross cutting scissors. Hemostasis was achieved with a suction cautery device.  Doyle splints were applied to the nasal septum.  The care of the patient was turned over to the anesthesiologist. The patient was awakened from anesthesia without difficulty. The patient was extubated and transferred to the recovery room in good condition.   OPERATIVE FINDINGS: Severe nasal septal deviation and bilateral inferior turbinate hypertrophy.   SPECIMEN: None.   FOLLOWUP CARE: The patient be discharged home once she is awake and alert. The patient will follow up in my office in 3 days for splint removal.   Crystal Velarde Dois Moccasin, MD

## 2023-07-28 NOTE — Anesthesia Postprocedure Evaluation (Signed)
 Anesthesia Post Note  Patient: Crystal Bush  Procedure(s) Performed: SEPTOPLASTY WITH NASAL TURBINATE REDUCTION (Bilateral: Nose)     Patient location during evaluation: PACU Anesthesia Type: General Level of consciousness: sedated and patient cooperative Pain management: pain level controlled Vital Signs Assessment: post-procedure vital signs reviewed and stable Respiratory status: spontaneous breathing Cardiovascular status: stable Anesthetic complications: no   No notable events documented.  Last Vitals:  Vitals:   07/28/23 1045 07/28/23 1100  BP: 121/67 119/83  Pulse: 64 72  Resp: 16 20  Temp:  (!) 36.4 C  SpO2: 97% 93%    Last Pain:  Vitals:   07/28/23 1110  TempSrc:   PainSc: 5                  Norleen Pope

## 2023-07-28 NOTE — Anesthesia Preprocedure Evaluation (Signed)
 Anesthesia Evaluation  Patient identified by MRN, date of birth, ID band Patient awake    Reviewed: Allergy  & Precautions, NPO status , Patient's Chart, lab work & pertinent test results  Airway Mallampati: II  TM Distance: >3 FB Neck ROM: Full    Dental no notable dental hx. (+) Dental Advisory Given   Pulmonary asthma    Pulmonary exam normal breath sounds clear to auscultation       Cardiovascular negative cardio ROS Normal cardiovascular exam Rhythm:Regular Rate:Normal     Neuro/Psych neg Seizures negative neurological ROS     GI/Hepatic negative GI ROS, Neg liver ROS,neg GERD  ,,  Endo/Other  negative endocrine ROS    Renal/GU negative Renal ROS     Musculoskeletal negative musculoskeletal ROS (+)    Abdominal   Peds  Hematology negative hematology ROS (+)   Anesthesia Other Findings   Reproductive/Obstetrics negative OB ROS                              Anesthesia Physical Anesthesia Plan  ASA: 2  Anesthesia Plan: General   Post-op Pain Management: Tylenol PO (pre-op)*   Induction: Intravenous  PONV Risk Score and Plan: 4 or greater and Treatment may vary due to age or medical condition, Ondansetron, Propofol infusion, TIVA, Midazolam and Dexamethasone  Airway Management Planned: Oral ETT  Additional Equipment:   Intra-op Plan:   Post-operative Plan: Extubation in OR  Informed Consent: I have reviewed the patients History and Physical, chart, labs and discussed the procedure including the risks, benefits and alternatives for the proposed anesthesia with the patient or authorized representative who has indicated his/her understanding and acceptance.     Dental advisory given  Plan Discussed with: CRNA  Anesthesia Plan Comments:          Anesthesia Quick Evaluation

## 2023-07-28 NOTE — Transfer of Care (Signed)
 Immediate Anesthesia Transfer of Care Note  Patient: Crystal Bush  Procedure(s) Performed: SEPTOPLASTY WITH NASAL TURBINATE REDUCTION (Bilateral: Nose)  Patient Location: PACU  Anesthesia Type:General  Level of Consciousness: drowsy  Airway & Oxygen Therapy: Patient Spontanous Breathing and Patient connected to face mask oxygen  Post-op Assessment: Report given to RN and Post -op Vital signs reviewed and stable  Post vital signs: Reviewed and stable  Last Vitals:  Vitals Value Taken Time  BP 113/75 07/28/23 10:17  Temp    Pulse 70 07/28/23 10:19  Resp 14 07/28/23 10:19  SpO2 96 % 07/28/23 10:19  Vitals shown include unfiled device data.  Last Pain:  Vitals:   07/28/23 0658  TempSrc: Temporal  PainSc: 0-No pain      Patients Stated Pain Goal: 3 (07/28/23 9341)  Complications: No notable events documented.

## 2023-07-28 NOTE — Anesthesia Procedure Notes (Signed)
 Procedure Name: Intubation Date/Time: 07/28/2023 8:59 AM  Performed by: Maudine Grayce ORN, CRNAPre-anesthesia Checklist: Patient identified, Emergency Drugs available, Suction available and Patient being monitored Patient Re-evaluated:Patient Re-evaluated prior to induction Oxygen Delivery Method: Circle System Utilized Preoxygenation: Pre-oxygenation with 100% oxygen Induction Type: IV induction Ventilation: Mask ventilation without difficulty Laryngoscope Size: Mac and 3 Grade View: Grade I Tube type: Oral Tube size: 7.0 mm Number of attempts: 1 Airway Equipment and Method: Stylet and Oral airway Placement Confirmation: ETT inserted through vocal cords under direct vision, positive ETCO2 and breath sounds checked- equal and bilateral Secured at: 21 cm Tube secured with: Tape Dental Injury: Teeth and Oropharynx as per pre-operative assessment

## 2023-07-29 ENCOUNTER — Encounter (HOSPITAL_BASED_OUTPATIENT_CLINIC_OR_DEPARTMENT_OTHER): Payer: Self-pay | Admitting: Otolaryngology

## 2023-07-31 ENCOUNTER — Ambulatory Visit (INDEPENDENT_AMBULATORY_CARE_PROVIDER_SITE_OTHER): Admitting: Otolaryngology

## 2023-07-31 ENCOUNTER — Encounter (INDEPENDENT_AMBULATORY_CARE_PROVIDER_SITE_OTHER): Payer: Self-pay | Admitting: Otolaryngology

## 2023-07-31 VITALS — Ht 62.0 in | Wt 115.0 lb

## 2023-07-31 DIAGNOSIS — J310 Chronic rhinitis: Secondary | ICD-10-CM

## 2023-07-31 DIAGNOSIS — Z9889 Other specified postprocedural states: Secondary | ICD-10-CM

## 2023-07-31 NOTE — Progress Notes (Signed)
 Doyle splints removed. Septum and turbinates are healing well.   Both [place] debrided.  Nasal saline irrigation.  Recheck in 3 weeks.

## 2023-08-12 NOTE — Progress Notes (Unsigned)
 Follow Up Note  RE: Crystal Bush MRN: [id number] DOB: [birth year] Date of Office Visit: 08/13/2023  Referring provider: Theotis Allena HERO, MD Primary care provider: Theotis Allena HERO, MD  Chief Complaint: No chief complaint on file.  History of Present Illness: I had the pleasure of seeing Crystal Bush for a follow up visit at the [facility] on 08/13/2023. She is a 23 y.o. female, who is being followed for asthma, allergic rhinoconjunctivitis, food allergy . Her previous allergy  office visit was on 02/13/2023 with Crystal Mutter, FNP. Today is a regular follow up visit.  Discussed the use of AI scribe software for clinical note transcription with the patient, who gave verbal consent to proceed.  History of Present Illness            Had sinus surgery.  Assessment and Plan: Ameshia is a 23 y.o. female with: Asthma Continue Arnuity 200-1 puff once a day to prevent cough or wheeze.  Continue albuterol  2 puffs once every 4 hours as needed for cough or wheeze You may use albuterol  2 puffs 5 to 15 minutes before activity to decrease cough or wheeze   Allergic rhinitis Continue allergen avoidance measures directed toward grass pollen, weed pollen, tree pollen, mold, and dust mite as listed below Continue levocetirizine (Xyzal ) 5 mg once a day. Remember to rotate to a different antihistamine about every 3 months. Some examples of over the counter antihistamines include Zyrtec (cetirizine), Xyzal  (levocetirizine), Allegra (fexofenadine), and Claritin (loratidine).  Continue Nasonex  1 spray in each nostril once a day as needed for stuffy nose.  In the right nostril, point the applicator out toward the right ear. In the left nostril, point the applicator out toward the left ear Consider saline nasal rinses as needed for nasal symptoms. Use this before any medicated nasal sprays for best result Consider allergen immunotherapy if your symptoms are not well-controlled with  the treatment plan as listed above. Written information provided. Would need to re-test environmental allergies before starting injections.    Allergic conjunctivitis Some over the counter eye drops include Pataday one drop in each eye once a day as needed for red, itchy eyes OR Zaditor one drop in each eye twice a day as needed for red itchy eyes.   Anosmia Continue Nasonex  1 spray in each nostril once a day as needed for stuffy nose.  In the right nostril, point the applicator out toward the right ear. In the left nostril, point the applicator out toward the left ear Follow up with ENT for further recommendations   Food allergy /vomiting/nausea/diarrhea Your skin testing was positive to fish and [place].  Your lab testing at your last visit was positive to fish and negative to avocado, shellfish, [place], chicken, egg, and alpha gal allergy . Continue to avoid fish and [place] at this time. In case of an allergic reaction, take Benadryl 50 mg every 4 hours, and if life-threatening symptoms occur, inject with EpiPen  0.3 mg. A referral for GI evaluation of nausea, vomiting, and diarrhea has been placed at your previous visit Assessment and Plan              No follow-ups on file.  No orders of the defined types were placed in this encounter.  Lab Orders  No laboratory test(s) ordered today    Diagnostics: Spirometry:  Tracings reviewed. Her effort: {Blank single:19197::Good reproducible efforts.,It was hard to get consistent efforts and there is a question as to whether this reflects a maximal maneuver.,Poor effort,  data can not be interpreted.} FVC: ***L FEV1: ***L, ***% predicted FEV1/FVC ratio: ***% Interpretation: {Blank single:19197::Spirometry consistent with mild obstructive disease,Spirometry consistent with moderate obstructive disease,Spirometry consistent with severe obstructive disease,Spirometry consistent with possible restrictive disease,Spirometry  consistent with mixed obstructive and restrictive disease,Spirometry uninterpretable due to technique,Spirometry consistent with normal pattern,No overt abnormalities noted given today's efforts}.  Please see scanned spirometry results for details.  Skin Testing: {Blank single:19197::Select foods,Environmental allergy  panel,Environmental allergy  panel and select foods,Food allergy  panel,None,Deferred due to recent antihistamines use}. *** Results discussed with patient/family.   Medication List:  Current Outpatient Medications  Medication Sig Dispense Refill   albuterol  (VENTOLIN  HFA) 108 (90 Base) MCG/ACT inhaler INHALE 2 PUFFS EVERY 4 TO 6 HOURS AS NEEDED FOR COUGH OR WHEEZE. 18 g 3   EPINEPHrine  (EPIPEN  2-PAK) 0.3 mg/0.3 mL IJ SOAJ injection Inject 0.3 mg into the muscle as needed for anaphylaxis. 2 each 1   Fluticasone  Furoate (ARNUITY ELLIPTA ) 200 MCG/ACT AEPB Inhale 1 puff into the lungs daily. 1 each 5   levocetirizine (XYZAL ) 5 MG tablet Take 1 tablet (5 mg total) by mouth every evening. 30 tablet 5   No current facility-administered medications for this visit.   Allergies: Allergies  Allergen Reactions   Amoxicillin  Hives   Fish Allergy  Itching and Nausea And Vomiting    Throat itching   Other Itching and Nausea And Vomiting    MALAWI - throat itching   I reviewed her past medical history, social history, family history, and environmental history and no significant changes have been reported from her previous visit.  Review of Systems  Constitutional:  Negative for appetite change, chills, fever and unexpected weight change.  HENT:  Negative for congestion and rhinorrhea.   Eyes:  Negative for itching.  Respiratory:  Negative for cough, chest tightness, shortness of breath and wheezing.   Cardiovascular:  Negative for chest pain.  Gastrointestinal:  Negative for abdominal pain.  Genitourinary:  Negative for difficulty urinating.  Skin:  Negative for  rash.  Allergic/Immunologic: Positive for environmental allergies and food allergies.  Neurological:  Negative for headaches.    Objective: LMP 07/16/2023 (Exact Date)  There is no height or weight on file to calculate BMI. Physical Exam Vitals and nursing note reviewed.  Constitutional:      Appearance: Normal appearance. She is well-developed.  HENT:     Head: Normocephalic and atraumatic.     Right Ear: Tympanic membrane and external ear normal.     Left Ear: Tympanic membrane and external ear normal.     Nose: Nose normal.     Mouth/Throat:     Mouth: Mucous membranes are moist.     Pharynx: Oropharynx is clear.  Eyes:     Conjunctiva/sclera: Conjunctivae normal.  Cardiovascular:     Rate and Rhythm: Normal rate and regular rhythm.     Heart sounds: Normal heart sounds. No murmur heard.    No friction rub. No gallop.  Pulmonary:     Effort: Pulmonary effort is normal.     Breath sounds: Normal breath sounds. No wheezing, rhonchi or rales.  Musculoskeletal:     Cervical back: Neck supple.  Skin:    General: Skin is warm.     Findings: No rash.  Neurological:     Mental Status: She is alert and oriented to person, place, and time.  Psychiatric:        Behavior: Behavior normal.    Previous notes and tests were reviewed. The plan was reviewed with  the patient/family, and all questions/concerned were addressed.  It was my pleasure to see Edee today and participate in her care. Please feel free to contact me with any questions or concerns.  Sincerely,  Orlan Cramp, DO Allergy  & Immunology  [facility]  [place] office: [phone] [facility] office: [phone]

## 2023-08-13 ENCOUNTER — Other Ambulatory Visit: Payer: Self-pay

## 2023-08-13 ENCOUNTER — Encounter: Payer: Self-pay | Admitting: Allergy

## 2023-08-13 ENCOUNTER — Ambulatory Visit (INDEPENDENT_AMBULATORY_CARE_PROVIDER_SITE_OTHER): Payer: No Typology Code available for payment source | Admitting: Allergy

## 2023-08-13 VITALS — BP 110/78 | HR 99 | Temp 98.7°F | Ht 61.81 in | Wt 114.3 lb

## 2023-08-13 DIAGNOSIS — J4540 Moderate persistent asthma, uncomplicated: Secondary | ICD-10-CM

## 2023-08-13 DIAGNOSIS — J302 Other seasonal allergic rhinitis: Secondary | ICD-10-CM | POA: Diagnosis not present

## 2023-08-13 DIAGNOSIS — T7800XD Anaphylactic reaction due to unspecified food, subsequent encounter: Secondary | ICD-10-CM

## 2023-08-13 DIAGNOSIS — H1010 Acute atopic conjunctivitis, unspecified eye: Secondary | ICD-10-CM

## 2023-08-13 DIAGNOSIS — H1013 Acute atopic conjunctivitis, bilateral: Secondary | ICD-10-CM

## 2023-08-13 DIAGNOSIS — J069 Acute upper respiratory infection, unspecified: Secondary | ICD-10-CM

## 2023-08-13 DIAGNOSIS — J3089 Other allergic rhinitis: Secondary | ICD-10-CM

## 2023-08-13 MED ORDER — ARNUITY ELLIPTA 200 MCG/ACT IN AEPB: INHALATION_SPRAY | RESPIRATORY_TRACT | 5 refills | Status: AC

## 2023-08-13 MED ORDER — LEVOCETIRIZINE DIHYDROCHLORIDE 5 MG PO TABS: 5.0000 mg | ORAL_TABLET | Freq: Every evening | ORAL | 3 refills | Status: AC

## 2023-08-13 NOTE — Patient Instructions (Addendum)
 Asthma Daily controller medication(s): restart Arnuity 200mcg 1 puff once a day and rinse mouth after each use.  May use albuterol  rescue inhaler 2 puffs every 4 to 6 hours as needed for shortness of breath, chest tightness, coughing, and wheezing. May use albuterol  rescue inhaler 2 puffs 5 to 15 minutes prior to strenuous physical activities. Monitor frequency of use - if you need to use it more than twice per week on a consistent basis let us  know.  Breathing control goals:  Full participation in all desired activities (may need albuterol  before activity) Albuterol  use two times or less a week on average (not counting use with activity) Cough interfering with sleep two times or less a month Oral steroids no more than once a year No hospitalizations   Allergic rhinitis Continue allergen avoidance measures directed toward grass pollen, weed pollen, tree pollen, mold, and dust mite. Use over the counter antihistamines such as Zyrtec (cetirizine), Claritin (loratadine), Allegra (fexofenadine), or Xyzal  (levocetirizine) daily as needed. May take twice a day during allergy  flares. May switch antihistamines every few months. Use Nasacort (triamcinolone) nasal spray 1-2 sprays per nostril once a day as needed for nasal congestion.  Nasal saline spray (i.e., Simply Saline) or nasal saline lavage (i.e., NeilMed) is recommended as needed and prior to medicated nasal sprays. Refresh eye drops as needed.   Anosmia Use Nasonex  (mometasone ) nasal spray 1 spray per nostril twice a day as needed for nasal congestion.  Nasal saline spray (i.e., Simply Saline) or nasal saline lavage (i.e., NeilMed) is recommended as needed and prior to medicated nasal sprays.  Food allergy  Continue to avoid fish and [place]. For mild symptoms you can take over the counter antihistamines (zyrtec 10mg  to 20mg ) and monitor symptoms closely.  If symptoms worsen or if you have severe symptoms including breathing issues, throat  closure, significant swelling, whole body hives, severe diarrhea and vomiting, lightheadedness then use epinephrine  and seek immediate medical care afterwards. Emergency action plan in place.   Follow up in 6 months or sooner if needed.  Respiratory infection Drink plenty of fluids. Water, juice, clear broth or warm lemon water are good choices. Avoid caffeine and alcohol, which can dehydrate you. Eat chicken soup. Chicken soup and other warm fluids can be soothing and loosen congestion. Rest. Adjust your room's temperature and humidity. Keep your room warm but not overheated. If the air is dry, a cool-mist humidifier or vaporizer can moisten the air and help ease congestion and coughing. Keep the humidifier clean to prevent the growth of bacteria and molds. Soothe your throat. Perform a saltwater gargle. Dissolve one-quarter to a half teaspoon of salt in a 4- to 8-ounce glass of warm water. This can relieve a sore or scratchy throat temporarily. Use saline nasal drops. To help relieve nasal congestion, try saline nasal drops. You can buy these drops over the counter, and they can help relieve symptoms ? even in children. Take over-the-counter cold and cough medications. For adults and children older than 5, over-the-counter decongestants, antihistamines and pain relievers might offer some symptom relief. However, they won't prevent a cold or shorten its duration.

## 2023-08-14 ENCOUNTER — Encounter: Payer: Self-pay | Admitting: Allergy

## 2023-08-26 ENCOUNTER — Ambulatory Visit (INDEPENDENT_AMBULATORY_CARE_PROVIDER_SITE_OTHER): Admitting: Otolaryngology

## 2023-08-26 ENCOUNTER — Encounter (INDEPENDENT_AMBULATORY_CARE_PROVIDER_SITE_OTHER): Payer: Self-pay | Admitting: Otolaryngology

## 2023-08-26 VITALS — BP 110/72 | HR 72

## 2023-08-26 DIAGNOSIS — Z9889 Other specified postprocedural states: Secondary | ICD-10-CM

## 2023-08-26 DIAGNOSIS — J310 Chronic rhinitis: Secondary | ICD-10-CM

## 2023-08-26 NOTE — Progress Notes (Signed)
 Septum and turbinates are healing well.   Both [place] debrided.   Nasal saline irrigation as needed.   Recheck in 6 months.

## 2023-10-24 ENCOUNTER — Encounter: Payer: Self-pay | Admitting: *Deleted

## 2024-02-08 NOTE — Progress Notes (Deleted)
 "  Follow Up Note  RE: Crystal Bush MRN: [id number] DOB: 29-Jul-2000 Date of Office Visit: 02/09/2024  Referring provider: Theotis Allena HERO, MD Primary care provider: Theotis Allena HERO, MD  Chief Complaint: No chief complaint on file.  History of Present Illness: I had the pleasure of seeing Crystal Bush for a follow up visit at the [facility] on 02/09/2024. She is a 24 y.o. female, who is being followed for asthma, allergic rhinoconjunctivitis, food allergies. Her previous allergy  office visit was on 08/13/2023 with Dr. Luke. Today is a regular follow up visit.  Discussed the use of AI scribe software for clinical note transcription with the patient, who gave verbal consent to proceed.  History of Present Illness            ***  Assessment and Plan: Crystal Bush is a 24 y.o. female with: Moderate persistent asthma without complication Current exacerbation likely due to recent illness and running out of Arnuity inhaler. Symptoms improved with Albuterol  inhaler. Doing better now. Daily controller medication(s): restart Arnuity 200mcg 1 puff once a day and rinse mouth after each use.  May use albuterol  rescue inhaler 2 puffs every 4 to 6 hours as needed for shortness of breath, chest tightness, coughing, and wheezing. May use albuterol  rescue inhaler 2 puffs 5 to 15 minutes prior to strenuous physical activities. Monitor frequency of use - if you need to use it more than twice per week on a consistent basis let us  know.  Get spirometry at next visit. No indication for prednisone  at this time.   Seasonal and perennial allergic rhinoconjunctivitis Declined AIT.  Continue allergen avoidance measures directed toward grass pollen, weed pollen, tree pollen, mold, and dust mite. Use over the counter antihistamines such as Zyrtec (cetirizine), Claritin (loratadine), Allegra (fexofenadine), or Xyzal  (levocetirizine) daily as needed. May take twice a day during allergy   flares. May switch antihistamines every few months. Use Nasacort (triamcinolone) nasal spray 1-2 sprays per nostril once a day as needed for nasal congestion.  Nasal saline spray (i.e., Simply Saline) or nasal saline lavage (i.e., NeilMed) is recommended as needed and prior to medicated nasal sprays. Refresh eye drops as needed.    Anaphylactic reaction due to food, subsequent encounter Continue to avoid fish and turkey. For mild symptoms you can take over the counter antihistamines (zyrtec 10mg  to 20mg ) and monitor symptoms closely.  If symptoms worsen or if you have severe symptoms including breathing issues, throat closure, significant swelling, whole body hives, severe diarrhea and vomiting, lightheadedness then use epinephrine  and seek immediate medical care afterwards. Emergency action plan in place.  Assessment and Plan              No follow-ups on file.  No orders of the defined types were placed in this encounter.  Lab Orders  No laboratory test(s) ordered today    Diagnostics: Spirometry:  Tracings reviewed. Her effort: {Blank single:19197::Good reproducible efforts.,It was hard to get consistent efforts and there is a question as to whether this reflects a maximal maneuver.,Poor effort, data can not be interpreted.} FVC: ***L FEV1: ***L, ***% predicted FEV1/FVC ratio: ***% Interpretation: {Blank single:19197::Spirometry consistent with mild obstructive disease,Spirometry consistent with moderate obstructive disease,Spirometry consistent with severe obstructive disease,Spirometry consistent with possible restrictive disease,Spirometry consistent with mixed obstructive and restrictive disease,Spirometry uninterpretable due to technique,Spirometry consistent with normal pattern,No overt abnormalities noted given today's efforts}.  Please see scanned spirometry results for details.  Skin Testing: {Blank single:19197::Select foods,Environmental  allergy  panel,Environmental allergy   panel and select foods,Food allergy  panel,None,Deferred due to recent antihistamines use}. *** Results discussed with patient/family.   Medication List:  Current Outpatient Medications  Medication Sig Dispense Refill   albuterol  (VENTOLIN  HFA) 108 (90 Base) MCG/ACT inhaler INHALE 2 PUFFS EVERY 4 TO 6 HOURS AS NEEDED FOR COUGH OR WHEEZE. 18 g 3   EPINEPHrine  (EPIPEN  2-PAK) 0.3 mg/0.3 mL IJ SOAJ injection Inject 0.3 mg into the muscle as needed for anaphylaxis. 2 each 1   Fluticasone  Furoate (ARNUITY ELLIPTA ) 200 MCG/ACT AEPB Rinse mouth after each use. 1 each 5   levocetirizine (XYZAL ) 5 MG tablet Take 1 tablet (5 mg total) by mouth every evening. 90 tablet 3   No current facility-administered medications for this visit.   Allergies: Allergies[1] I reviewed her past medical history, social history, family history, and environmental history and no significant changes have been reported from her previous visit.  Review of Systems  Constitutional:  Negative for appetite change, chills, fever and unexpected weight change.  HENT:  Positive for congestion. Negative for rhinorrhea.   Eyes:  Negative for itching.  Respiratory:  Positive for cough and wheezing. Negative for chest tightness and shortness of breath.   Cardiovascular:  Negative for chest pain.  Gastrointestinal:  Negative for abdominal pain.  Genitourinary:  Negative for difficulty urinating.  Skin:  Negative for rash.  Allergic/Immunologic: Positive for environmental allergies and food allergies.  Neurological:  Negative for headaches.    Objective: There were no vitals taken for this visit. There is no height or weight on file to calculate BMI. Physical Exam Vitals and nursing note reviewed.  Constitutional:      Appearance: Normal appearance. She is well-developed.  HENT:     Head: Normocephalic and atraumatic.     Right Ear: Tympanic membrane and external ear normal.      Left Ear: Tympanic membrane and external ear normal.     Nose: Nose normal.     Mouth/Throat:     Mouth: Mucous membranes are moist.     Pharynx: Oropharynx is clear.  Eyes:     Conjunctiva/sclera: Conjunctivae normal.  Cardiovascular:     Rate and Rhythm: Normal rate and regular rhythm.     Heart sounds: Normal heart sounds. No murmur heard.    No friction rub. No gallop.  Pulmonary:     Effort: Pulmonary effort is normal.     Breath sounds: Normal breath sounds. No wheezing, rhonchi or rales.  Musculoskeletal:     Cervical back: Neck supple.  Skin:    General: Skin is warm.     Findings: No rash.  Neurological:     Mental Status: She is alert and oriented to person, place, and time.  Psychiatric:        Behavior: Behavior normal.    Previous notes and tests were reviewed. The plan was reviewed with the patient/family, and all questions/concerned were addressed.  It was my pleasure to see Crystal Bush today and participate in her care. Please feel free to contact me with any questions or concerns.  Sincerely,  Orlan Cramp, DO Allergy  & Immunology  [facility]  [facility] office: [phone] [facility] office: [phone]    [1]  Allergies Allergen Reactions   Amoxicillin  Hives   Fish Allergy  Itching and Nausea And Vomiting    Throat itching   Other Itching and Nausea And Vomiting    TURKEY - throat itching   "

## 2024-02-09 ENCOUNTER — Ambulatory Visit: Admitting: Allergy

## 2024-02-09 DIAGNOSIS — J302 Other seasonal allergic rhinitis: Secondary | ICD-10-CM

## 2024-02-09 DIAGNOSIS — H1010 Acute atopic conjunctivitis, unspecified eye: Secondary | ICD-10-CM

## 2024-02-09 DIAGNOSIS — J4540 Moderate persistent asthma, uncomplicated: Secondary | ICD-10-CM

## 2024-02-09 DIAGNOSIS — T7800XD Anaphylactic reaction due to unspecified food, subsequent encounter: Secondary | ICD-10-CM

## 2024-02-12 ENCOUNTER — Other Ambulatory Visit: Payer: Self-pay | Admitting: Family Medicine

## 2024-02-23 ENCOUNTER — Telehealth: Payer: Self-pay

## 2024-02-23 ENCOUNTER — Other Ambulatory Visit (HOSPITAL_COMMUNITY): Payer: Self-pay

## 2024-02-23 NOTE — Telephone Encounter (Signed)
*  [facility]  Pharmacy Patient Advocate Encounter   Received notification from Fax that prior authorization for Fluticasone  Furoate Ellipta is required/requested.   Insurance verification completed.   The patient is insured through [employer].   Per test claim:  Brand Arnuity is preferred by the insurance.  If suggested medication is appropriate, Please send in a new RX and discontinue this one. If not, please advise as to why it's not appropriate so that we may request a Prior Authorization. Please note, some preferred medications may still require a PA.  If the suggested medications have not been trialed and there are no contraindications to their use, the PA will not be submitted, as it will not be approved. Archived Key: [id number]   Nothing further needed, refill too soon until 03/07/2024

## 2024-02-27 ENCOUNTER — Ambulatory Visit (INDEPENDENT_AMBULATORY_CARE_PROVIDER_SITE_OTHER): Admitting: Otolaryngology

## 2024-04-09 ENCOUNTER — Ambulatory Visit (INDEPENDENT_AMBULATORY_CARE_PROVIDER_SITE_OTHER): Admitting: Otolaryngology

## 2024-04-13 ENCOUNTER — Ambulatory Visit: Admitting: Cardiology
